# Patient Record
Sex: Male | Born: 2005 | Race: Black or African American | Hispanic: No | Marital: Single | Smoking: Never smoker
Health system: Southern US, Community
[De-identification: ages and names within clinical notes are randomized; demographics above are authoritative.]

## PROBLEM LIST (undated history)

## (undated) DIAGNOSIS — J45909 Unspecified asthma, uncomplicated: Secondary | ICD-10-CM

## (undated) DIAGNOSIS — F419 Anxiety disorder, unspecified: Secondary | ICD-10-CM

## (undated) DIAGNOSIS — K5900 Constipation, unspecified: Secondary | ICD-10-CM

## (undated) DIAGNOSIS — T7840XA Allergy, unspecified, initial encounter: Secondary | ICD-10-CM

## (undated) DIAGNOSIS — L309 Dermatitis, unspecified: Secondary | ICD-10-CM

## (undated) DIAGNOSIS — F4010 Social phobia, unspecified: Secondary | ICD-10-CM

## (undated) DIAGNOSIS — G43909 Migraine, unspecified, not intractable, without status migrainosus: Secondary | ICD-10-CM

## (undated) DIAGNOSIS — F32A Depression, unspecified: Secondary | ICD-10-CM

## (undated) DIAGNOSIS — D563 Thalassemia minor: Secondary | ICD-10-CM

## (undated) DIAGNOSIS — F909 Attention-deficit hyperactivity disorder, unspecified type: Secondary | ICD-10-CM

## (undated) HISTORY — DX: Thalassemia minor: D56.3

## (undated) HISTORY — DX: Dermatitis, unspecified: L30.9

## (undated) HISTORY — DX: Unspecified asthma, uncomplicated: J45.909

## (undated) HISTORY — DX: Migraine, unspecified, not intractable, without status migrainosus: G43.909

## (undated) HISTORY — DX: Depression, unspecified: F32.A

## (undated) HISTORY — DX: Social phobia, unspecified: F40.10

## (undated) HISTORY — DX: Anxiety disorder, unspecified: F41.9

## (undated) HISTORY — DX: Constipation, unspecified: K59.00

## (undated) HISTORY — DX: Attention-deficit hyperactivity disorder, unspecified type: F90.9

## (undated) HISTORY — DX: Allergy, unspecified, initial encounter: T78.40XA

---
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
## 2024-01-01 DIAGNOSIS — F4011 Social phobia, generalized: Secondary | ICD-10-CM | POA: Diagnosis not present

## 2024-01-01 DIAGNOSIS — F29 Unspecified psychosis not due to a substance or known physiological condition: Secondary | ICD-10-CM | POA: Diagnosis not present

## 2024-01-01 DIAGNOSIS — F902 Attention-deficit hyperactivity disorder, combined type: Secondary | ICD-10-CM | POA: Diagnosis not present

## 2024-02-05 ENCOUNTER — Telehealth: Payer: Self-pay

## 2024-02-05 NOTE — Telephone Encounter (Signed)
 Needs 17 yr wcc

## 2024-02-15 DIAGNOSIS — H5213 Myopia, bilateral: Secondary | ICD-10-CM | POA: Diagnosis not present

## 2024-02-17 NOTE — Telephone Encounter (Signed)
2nd attempt-LVM to return call 

## 2024-03-08 ENCOUNTER — Other Ambulatory Visit: Payer: Self-pay | Admitting: Allergy & Immunology

## 2024-03-17 NOTE — Telephone Encounter (Signed)
3rd attempt-LVM to return call 

## 2024-03-24 ENCOUNTER — Ambulatory Visit (INDEPENDENT_AMBULATORY_CARE_PROVIDER_SITE_OTHER): Payer: Medicaid Other | Admitting: Family Medicine

## 2024-03-24 ENCOUNTER — Other Ambulatory Visit: Payer: Self-pay

## 2024-03-24 ENCOUNTER — Encounter: Payer: Self-pay | Admitting: Allergy & Immunology

## 2024-03-24 VITALS — BP 122/72 | HR 76 | Temp 98.2°F | Resp 20 | Ht 66.93 in | Wt 214.0 lb

## 2024-03-24 DIAGNOSIS — T7800XD Anaphylactic reaction due to unspecified food, subsequent encounter: Secondary | ICD-10-CM

## 2024-03-24 DIAGNOSIS — J302 Other seasonal allergic rhinitis: Secondary | ICD-10-CM | POA: Diagnosis not present

## 2024-03-24 DIAGNOSIS — J4540 Moderate persistent asthma, uncomplicated: Secondary | ICD-10-CM

## 2024-03-24 DIAGNOSIS — J3089 Other allergic rhinitis: Secondary | ICD-10-CM | POA: Diagnosis not present

## 2024-03-24 NOTE — Patient Instructions (Addendum)
 Asthma Continue albuterol  2 puffs once every 4 hours if needed for cough or wheeze You may use albuterol  2 puffs 5 to 15 minutes before activity to decrease cough or wheeze Continue Symbicort  160-2 puffs as needed  Allergic rhinitis Continue allergen avoidance measures directed toward grass pollen, weed pollen, tree pollen, mold, dust mite, cat, and cockroach as listed below Continue cetirizine  10 mg once a day if needed for a runny nose or itch Continue Flonase  2 sprays in each nostril once a day if needed for a stuffy nose Continue Patanase  2 sprays in each nostril up to twice a day if needed for runny nose. Consider saline nasal rinses as needed for nasal symptoms. Use this before any medicated nasal spr Jheri ays for best result  Reflux Continue dietary and lifestyle modifications as listed below Continue famotidine  40 mg once a day to control reflux RA Call the clinic if this treatment plan is not working well for you  Follow up in 6 months or sooner if needed.  Reducing Pollen Exposure The American Academy of Allergy, Asthma and Immunology suggests the following steps to reduce your exposure to pollen during allergy seasons. Do not hang sheets or clothing out to dry; pollen may collect on these items. Do not mow lawns or spend time around freshly cut grass; mowing stirs up pollen. Keep windows closed at night.  Keep car windows closed while driving. Minimize morning activities outdoors, a time when pollen counts are usually at their highest. Stay indoors as much as possible when pollen counts or humidity is high and on windy days when pollen tends to remain in the air longer. Use air conditioning when possible.  Many air conditioners have filters that trap the pollen spores. Use a HEPA room air filter to remove pollen form the indoor air you breathe.  Control of Mold Allergen Mold and fungi can grow on a variety of surfaces provided certain temperature and moisture conditions  exist.  Outdoor molds grow on plants, decaying vegetation and soil.  The major outdoor mold, Alternaria and Cladosporium, are found in very high numbers during hot and dry conditions.  Generally, a late Summer - Fall peak is seen for common outdoor fungal spores.  Rain will temporarily lower outdoor mold spore count, but counts rise rapidly when the rainy period ends.  The most important indoor molds are Aspergillus and Penicillium.  Dark, humid and poorly ventilated basements are ideal sites for mold growth.  The next most common sites of mold growth are the bathroom and the kitchen.  Outdoor [employer] Use air conditioning and keep windows closed Avoid exposure to decaying vegetation. Avoid leaf raking. Avoid grain handling. Consider wearing a face mask if working in moldy areas.  Indoor Mold Control Maintain humidity below 50%. Clean washable surfaces with 5% bleach solution. Remove sources e.g. Contaminated carpets.   Control of Dust Mite Allergen Dust mites play a major role in allergic asthma and rhinitis. They occur in environments with high humidity wherever human skin is found. Dust mites absorb humidity from the atmosphere (ie, they do not drink) and feed on organic matter (including shed human and animal skin). Dust mites are a microscopic type of insect that you cannot see with the naked eye. High levels of dust mites have been detected from mattresses, pillows, carpets, upholstered furniture, bed covers, clothes, soft toys and any woven material. The principal allergen of the dust mite is found in its feces. A gram of dust may contain 1,000 mites  and 250,000 fecal particles. Mite antigen is easily measured in the air during house cleaning activities. Dust mites do not bite and do not cause harm to humans, other than by triggering allergies/asthma.  Ways to decrease your exposure to dust mites in your home:  1. Encase mattresses, box springs and pillows with a mite-impermeable  barrier or cover  2. Wash sheets, blankets and drapes weekly in hot water (130 F) with detergent and dry them in a dryer on the hot setting.  3. Have the room cleaned frequently with a vacuum cleaner and a damp dust-mop. For carpeting or rugs, vacuuming with a vacuum cleaner equipped with a high-efficiency particulate air (HEPA) filter. The dust mite allergic individual should not be in a room which is being cleaned and should wait 1 hour after cleaning before going into the room.  4. Do not sleep on upholstered furniture (eg, couches).  5. If possible removing carpeting, upholstered furniture and drapery from the home is ideal. Horizontal blinds should be eliminated in the rooms where the person spends the most time (bedroom, study, television room). Washable vinyl, roller-type shades are optimal.  6. Remove all non-washable stuffed toys from the bedroom. Wash stuffed toys weekly like sheets and blankets above.  7. Reduce indoor humidity to less than 50%. Inexpensive humidity monitors can be purchased at most hardware stores. Do not use a humidifier as can make the problem worse and are not recommended.  Control of Dog or Cat Allergen Avoidance is the best way to manage a dog or cat allergy. If you have a dog or cat and are allergic to dog or cats, consider removing the dog or cat from the home. If you have a dog or cat but don't want to find it a new home, or if your family wants a pet even though someone in the household is allergic, here are some strategies that may help keep symptoms at bay:  Keep the pet out of your bedroom and restrict it to only a few rooms. Be advised that keeping the dog or cat in only one room will not limit the allergens to that room. Don't pet, hug or kiss the dog or cat; if you do, wash your hands with soap and water. High-efficiency particulate air (HEPA) cleaners run continuously in a bedroom or living room can reduce allergen levels over time. Regular use of a  high-efficiency vacuum cleaner or a central vacuum can reduce allergen levels. Giving your dog or cat a bath at least once a week can reduce airborne allergen.   Control of Cockroach Allergen Cockroach allergen has been identified as an important cause of acute attacks of asthma, especially in urban settings.  There are fifty-five species of cockroach that exist in the [place] , however only three, the [place], [place] and [place] species produce allergen that can affect patients with Asthma.  Allergens can be obtained from fecal particles, egg casings and secretions from cockroaches.    Remove food sources. Reduce access to water. Seal access and entry points. Spray runways with 0.5-1% Diazinon or Chlorpyrifos Blow boric acid power under stoves and refrigerator. Place bait stations (hydramethylnon) at feeding sites.   Lifestyle Changes for Controlling GERD When you have GERD, stomach acid feels as if it's backing up toward your mouth. Whether or not you take medication to control your GERD, your symptoms can often be improved with lifestyle changes.   Raise Your Head Reflux is more likely to strike when you're lying down flat, because  stomach fluid can flow backward more easily. Raising the head of your bed 4-6 inches can help. To do this: Slide blocks or books under the legs at the head of your bed. Or, place a wedge under the mattress. Many foam stores can make a suitable wedge for you. The wedge should run from your waist to the top of your head. Don't just prop your head on several pillows. This increases pressure on your stomach. It can make GERD worse.  Watch Your Eating Habits Certain foods may increase the acid in your stomach or relax the lower esophageal sphincter, making GERD more likely. It's best to avoid the following: Coffee, tea, and carbonated drinks (with and without caffeine) Fatty, fried, or spicy food Mint, chocolate, onions, and tomatoes Any other foods  that seem to irritate your stomach or cause you pain  Relieve the Pressure Eat smaller meals, even if you have to eat more often. Don't lie down right after you eat. Wait a few hours for your stomach to empty. Avoid tight belts and tight-fitting clothes. Lose excess weight.  Tobacco and Alcohol Avoid smoking tobacco and drinking alcohol. They can make GERD symptoms worse.

## 2024-03-24 NOTE — Progress Notes (Addendum)
 [address] [place] [address] Dept: [phone]  FOLLOW UP NOTE  Patient ID: Randy [name], male    DOB: 06-Dec-2005  Age: 18 y.o. MRN: [id number] Date of Office Visit: 03/24/2024  Assessment  Chief Complaint: Follow-up  HPI Randy [name] is an 18 year old male who presents to the clinic for follow-up visit.  He was last seen in clinic on 09/22/2023 by Dr. Iva for evaluation of asthma, allergic rhinitis, reflux, and food allergy to peanuts and tree nuts.  He is accompanied by his mother who assists with history.  He continues to study Lobbyist in college.  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  At today's visit, he reports his asthma has been well-controlled with no symptoms including shortness of breath, cough, or wheeze with activity or rest.  He continues Symbicort  as needed and he has used his albuterol  once a month with relief of symptoms.  Allergic rhinitis is reported as moderately well-controlled with symptoms including clear rhinorrhea, sneezing, and postnasal drainage which are all worse after he has been outside.  He continues Flonase  as needed and Patanase  as needed.  He has previously received allergen immunotherapy directed toward grass pollen, weed pollen, tree pollen, mold, dust mite, cat, and cockroach, however, he did have an anaphylactic reaction to allergen immunotherapy.  Reflux is reported as well-controlled with occasional heartburn occurring especially after eating beef.  He continues famotidine  40 mg once a day only if needed for symptoms of reflux.  His current medications are listed in the chart.  Drug Allergies:  Allergies  Allergen Reactions   Shellfish Allergy Itching and Shortness Of Breath   Wheat Itching and Shortness Of Breath   Lactose Diarrhea    Vomiting and Diarrhea   Other Other (See Comments)    red meats, including beef, pork, and mutton   Peanut -Containing Drug Products Itching     Physical Exam: BP 122/72 (BP Location: Left Arm, Patient Position: Sitting, Cuff Size: Normal)   Pulse 76   Temp 98.2 F (36.8 C) (Temporal)   Resp 20   Ht 5' 6.93 (1.7 m)   Wt 214 lb (97.1 kg)   SpO2 96%   BMI 33.59 kg/m    Physical Exam Vitals reviewed.  Constitutional:      Appearance: Normal appearance.  HENT:     Head: Normocephalic and atraumatic.     Right Ear: Tympanic membrane normal.     Left Ear: Tympanic membrane normal.     Nose:     Comments: Bilateral nares slightly erythematous with thin clear nasal drainage noted.  Pharynx normal.  Ears normal.  Eyes normal.    Mouth/Throat:     Pharynx: Oropharynx is clear.   Eyes:     Conjunctiva/sclera: Conjunctivae normal.    Cardiovascular:     Rate and Rhythm: Normal rate and regular rhythm.     Heart sounds: Normal heart sounds. No murmur heard. Pulmonary:     Effort: Pulmonary effort is normal.     Breath sounds: Normal breath sounds.     Comments: Lungs clear to auscultation  Musculoskeletal:        General: Normal range of motion.     Cervical back: Normal range of motion and neck supple.   Skin:    General: Skin is warm and dry.   Neurological:     Mental Status: He is alert and oriented to person, place, and time.   Psychiatric:  Mood and Affect: Mood normal.        Behavior: Behavior normal.        Thought Content: Thought content normal.        Judgment: Judgment normal.     Assessment and Plan: 1. Moderate persistent asthma, uncomplicated   2. Seasonal and perennial allergic rhinitis   3. Anaphylactic shock due to food, subsequent encounter     Patient Instructions  Asthma Continue albuterol  2 puffs once every 4 hours if needed for cough or wheeze You may use albuterol  2 puffs 5 to 15 minutes before activity to decrease cough or wheeze Continue Symbicort  160-2 puffs as needed  Allergic rhinitis Continue allergen avoidance measures directed toward grass pollen, weed  pollen, tree pollen, mold, dust mite, cat, and cockroach as listed below Continue cetirizine  10 mg once a day if needed for a runny nose or itch Continue Flonase  2 sprays in each nostril once a day if needed for a stuffy nose Continue Patanase  2 sprays in each nostril up to twice a day if needed for runny nose. Consider saline nasal rinses as needed for nasal symptoms. Use this before any medicated nasal spr Jheri ays for best result  Reflux Continue dietary and lifestyle modifications as listed below Continue famotidine  40 mg once a day to control reflux  Call the clinic if this treatment plan is not working well for you  Follow up in 6 months or sooner if needed.   Return in about 6 months (around 09/23/2024), or if symptoms worsen or fail to improve.    Thank you for the opportunity to care for this patient.  Please do not hesitate to contact me with questions.  Arlean Mutter, FNP [facility] 

## 2024-03-25 ENCOUNTER — Encounter: Payer: Self-pay | Admitting: Family Medicine

## 2024-03-30 DIAGNOSIS — F4011 Social phobia, generalized: Secondary | ICD-10-CM | POA: Diagnosis not present

## 2024-03-30 DIAGNOSIS — F29 Unspecified psychosis not due to a substance or known physiological condition: Secondary | ICD-10-CM | POA: Diagnosis not present

## 2024-03-30 DIAGNOSIS — F902 Attention-deficit hyperactivity disorder, combined type: Secondary | ICD-10-CM | POA: Diagnosis not present

## 2024-04-19 ENCOUNTER — Other Ambulatory Visit: Payer: Self-pay | Admitting: Allergy & Immunology

## 2024-05-05 DIAGNOSIS — W448XXA Other foreign body entering into or through a natural orifice, initial encounter: Secondary | ICD-10-CM | POA: Diagnosis not present

## 2024-05-05 DIAGNOSIS — T162XXA Foreign body in left ear, initial encounter: Secondary | ICD-10-CM | POA: Diagnosis not present

## 2024-05-20 ENCOUNTER — Other Ambulatory Visit: Payer: Self-pay | Admitting: Allergy & Immunology

## 2024-05-29 ENCOUNTER — Other Ambulatory Visit (INDEPENDENT_AMBULATORY_CARE_PROVIDER_SITE_OTHER): Payer: Self-pay | Admitting: Neurology

## 2024-05-29 DIAGNOSIS — G43109 Migraine with aura, not intractable, without status migrainosus: Secondary | ICD-10-CM

## 2024-05-30 ENCOUNTER — Other Ambulatory Visit: Payer: Self-pay | Admitting: Allergy & Immunology

## 2024-06-07 ENCOUNTER — Other Ambulatory Visit: Payer: Self-pay | Admitting: Allergy & Immunology

## 2024-06-24 DIAGNOSIS — F29 Unspecified psychosis not due to a substance or known physiological condition: Secondary | ICD-10-CM | POA: Diagnosis not present

## 2024-06-24 DIAGNOSIS — F902 Attention-deficit hyperactivity disorder, combined type: Secondary | ICD-10-CM | POA: Diagnosis not present

## 2024-06-24 DIAGNOSIS — F4011 Social phobia, generalized: Secondary | ICD-10-CM | POA: Diagnosis not present

## 2024-08-18 DIAGNOSIS — H6691 Otitis media, unspecified, right ear: Secondary | ICD-10-CM | POA: Diagnosis not present

## 2024-08-25 DIAGNOSIS — J343 Hypertrophy of nasal turbinates: Secondary | ICD-10-CM | POA: Diagnosis not present

## 2024-08-25 DIAGNOSIS — H60333 Swimmer's ear, bilateral: Secondary | ICD-10-CM | POA: Diagnosis not present

## 2024-09-10 ENCOUNTER — Other Ambulatory Visit: Payer: Self-pay | Admitting: Allergy & Immunology

## 2024-09-16 DIAGNOSIS — F4321 Adjustment disorder with depressed mood: Secondary | ICD-10-CM | POA: Diagnosis not present

## 2024-09-16 DIAGNOSIS — F4011 Social phobia, generalized: Secondary | ICD-10-CM | POA: Diagnosis not present

## 2024-09-16 DIAGNOSIS — F902 Attention-deficit hyperactivity disorder, combined type: Secondary | ICD-10-CM | POA: Diagnosis not present

## 2024-10-08 ENCOUNTER — Encounter: Payer: Self-pay | Admitting: Allergy & Immunology

## 2024-10-08 ENCOUNTER — Ambulatory Visit: Admitting: Allergy & Immunology

## 2024-10-08 ENCOUNTER — Other Ambulatory Visit: Payer: Self-pay

## 2024-10-08 ENCOUNTER — Other Ambulatory Visit: Payer: Self-pay | Admitting: Allergy & Immunology

## 2024-10-08 VITALS — BP 124/76 | HR 72 | Temp 98.2°F | Ht 67.99 in | Wt 216.2 lb

## 2024-10-08 DIAGNOSIS — J302 Other seasonal allergic rhinitis: Secondary | ICD-10-CM

## 2024-10-08 DIAGNOSIS — J3089 Other allergic rhinitis: Secondary | ICD-10-CM | POA: Diagnosis not present

## 2024-10-08 DIAGNOSIS — L2089 Other atopic dermatitis: Secondary | ICD-10-CM | POA: Diagnosis not present

## 2024-10-08 DIAGNOSIS — T7800XD Anaphylactic reaction due to unspecified food, subsequent encounter: Secondary | ICD-10-CM

## 2024-10-08 DIAGNOSIS — J4541 Moderate persistent asthma with (acute) exacerbation: Secondary | ICD-10-CM | POA: Diagnosis not present

## 2024-10-08 DIAGNOSIS — K219 Gastro-esophageal reflux disease without esophagitis: Secondary | ICD-10-CM

## 2024-10-08 MED ORDER — FLUTICASONE PROPIONATE 50 MCG/ACT NA SUSP: 2.0000 | Freq: Every day | NASAL | 1 refills | Status: AC

## 2024-10-08 MED ORDER — CETIRIZINE HCL 10 MG PO TABS: 10.0000 mg | ORAL_TABLET | Freq: Every day | ORAL | 1 refills | Status: AC

## 2024-10-08 MED ORDER — PREDNISONE 10 MG PO TABS: ORAL_TABLET | ORAL | 0 refills | Status: AC

## 2024-10-08 MED ORDER — ALBUTEROL SULFATE HFA 108 (90 BASE) MCG/ACT IN AERS: 2.0000 | INHALATION_SPRAY | RESPIRATORY_TRACT | 2 refills | Status: AC | PRN

## 2024-10-08 MED ORDER — SYMBICORT 160-4.5 MCG/ACT IN AERO: INHALATION_SPRAY | RESPIRATORY_TRACT | 1 refills | Status: AC

## 2024-10-08 NOTE — Patient Instructions (Addendum)
 1. Mild persistent asthma with acute exacerbation - Lung testing looked a lot lower today, but it did improve with the albuterol . - Start the prednisone  taper and continue for 9 days. - Continue with the Symbicort  and use 2 puffs every 4-6 hours for ONE TO TWO WEEKS. - Daily controller medication(s): NOTHING - Rescue medications: albuterol  4 puffs every 4-6 hours as needed - Changes during respiratory infections or worsening symptoms: Add on Symbicort  160/4.50mcg to 2 puffs every 4-6 hours for ONE TO TWO WEEKS. - Asthma control goals:  * Full participation in all desired activities (may need albuterol  before activity) * Albuterol  use two time or less a week on average (not counting use with activity) * Cough interfering with sleep two time or less a month * Oral steroids no more than once a year * No hospitalizations  3. Seasonal and perennial allergic rhinitis - Continue Flonase  (fluticasone ) 1-2 sprays each nostril once a day AS NEEDED for stuffy nose - Continue with levocetirizine 5mg  daily.   3. Adverse food reaction (peanuts, tree nuts) - Continue to avoid peanuts and tree nuts. - Consider a food challenge at some point to get this off of his allergy list.  - Avoid peanuts and tree nuts. In case of an allergic reaction, give Benadryl 4 teaspoonfuls every 4 hours, and if life-threatening symptoms occur, inject with EpiPen  0.3 mg.  4. GERD  - Continue with Pepcid  (famotidine ) 40mg  daily. - Refills sent in.   5. Return in about 3 months (around 01/06/2025). You can have the follow up appointment with Dr. Iva or a Nurse Practicioner (our Nurse Practitioners are excellent and always have Physician oversight!).    Please inform us  of any Emergency Department visits, hospitalizations, or changes in symptoms. Call us  before going to the ED for breathing or allergy symptoms since we might be able to fit you in for a sick visit. Feel free to contact us  anytime with any questions,  problems, or concerns.  It was a pleasure to see you and your family again today!  Websites that have reliable patient information: 1. American Academy of Asthma, Allergy, and Immunology: www.aaaai.org 2. Food Allergy Research and Education (FARE): foodallergy.org 3. Mothers of Asthmatics: http://www.asthmacommunitynetwork.org 4. American College of Allergy, Asthma, and Immunology: www.acaai.org      Like us  on [employer] and Instagram for our latest updates!      A healthy democracy works best when [employer] participate! Make sure you are registered to vote! If you have moved or changed any of your contact information, you will need to get this updated before voting! Scan the QR codes below to learn more!

## 2024-10-08 NOTE — Progress Notes (Unsigned)
 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 Mild persistent asthma with acute exacerbation - Lung testing looked a lot lower today, but it did improve with the albuterol . - Start the prednisone  taper and continue for 9 days. - Continue with the Symbicort  and use 2 puffs every 4-6 hours for ONE TO TWO WEEKS. - Daily controller medication(s): NOTHING - Rescue medications: albuterol  4 puffs every 4-6 hours as needed - Changes during respiratory infections or worsening symptoms: Add on Symbicort  160/4.66mcg to 2 puffs every 4-6 hours for ONE TO TWO WEEKS. - Asthma control goals:  * Full participation in all desired activities (may need albuterol  before activity) * Albuterol  use two time or less a week on average (not counting use with activity) * Cough interfering with sleep two time or less a month * Oral steroids no more than once a year * No hospitalizations  3. Seasonal and perennial allergic rhinitis - Continue Flonase  (fluticasone ) 1-2 sprays each nostril once a day AS NEEDED for stuffy nose - Continue with levocetirizine 5mg  daily.   3. Adverse food reaction (peanuts, tree nuts) - Continue to avoid peanuts and tree nuts. - Consider a food challenge at some point to get this off of his allergy list.  - Avoid peanuts and tree nuts. In case of an allergic reaction, give Benadryl 4 teaspoonfuls every 4 hours, and if life-threatening symptoms occur, inject with EpiPen  0.3 mg.  4. GERD  - Continue with Pepcid  (famotidine ) 40mg  daily. - Refills sent in.   5. Return in about 3  months (around 01/06/2025). You can have the follow up appointment with Dr. Iva or a Nurse Practicioner (our Nurse Practitioners are excellent and always have Physician oversight!).    Please inform us  of any Emergency Department visits, hospitalizations, or changes in symptoms. Call us  before going to the ED for breathing or allergy symptoms since we might be able to fit you in for a sick visit. Feel free to contact us  anytime with any questions, problems, or concerns.  It was a pleasure to see you and your family again today!  Websites that have reliable patient information: 1. American Academy of Asthma, Allergy, and Immunology: www.aaaai.org 2. Food Allergy Research and Education (FARE): foodallergy.org 3. Mothers of Asthmatics: http://www.asthmacommunitynetwork.org 4. American College of Allergy, Asthma, and Immunology: www.acaai.org      Like us  on [employer] and Instagram for our latest updates!      A healthy democracy works best when [employer] participate! Make sure you are registered to vote! If you have moved or changed any of your contact information, you will need to get this updated before voting! Scan the QR codes below to learn more!                  Subjective:   ROBERTT BUDA is a 19 y.o. male presenting today for follow up of  Chief Complaint  Patient presents with   Asthma   Anaphylactic shock due to food   Follow-up    Running nose,hot and cold   Cough    LUSTER HECHLER has  a history of the following: Patient Active Problem List   Diagnosis Date Noted   Seasonal allergic conjunctivitis 10/17/2022   Seasonal and perennial allergic rhinitis 04/07/2018   Anaphylactic shock due to adverse food reaction 10/07/2017   Adverse food reaction 03/25/2017   Other seasonal allergic rhinitis 03/25/2017   Mild persistent asthma without complication 03/25/2017   Schizophrenia (HCC) 11/21/2015   Migraine with aura and without status  migrainosus, not intractable 11/29/2014   Tension headache 11/29/2014   Anxiety 11/29/2014   Circadian rhythm sleep disorder, 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  Discussed the use of AI scribe software for clinical note transcription with the patient and/or guardian, who gave verbal consent to proceed.  Rickard is a 19 y.o. male presenting for {Blank single:19197::a food challenge,a drug challenge,skin testing,a sick visit,an evaluation of ***,a follow up visit}. He was last seen in June 2025 by Arlean Mutter.  At that time, we continue with albuterol  as needed.  He also was doing the Symbicort  2 puffs as needed as well.  For his allergic rhinitis, he remained on Zyrtec  as well as Flonase  and Patanase .  Reflux was Limiting.  Since last visit,  Asthma/Respiratory Symptom History: ***  Allergic Rhinitis Symptom History: ***  Food Allergy Symptom History: ***  Skin Symptom History: ***  GERD Symptom History: ***  Infection Symptom History: ***  Otherwise, there have been no changes to his past medical history, surgical history, family history, or social history.    Review of systems otherwise negative other than that mentioned in the HPI.    Objective:   Blood pressure 124/76, pulse 72, temperature 98.2 F (36.8 C), temperature source Temporal, height 5' 7.99 (1.727 m), weight 216 lb 3.2 oz (98.1 kg), SpO2 97%. Body mass index is 32.88 kg/m.    Physical Exam   Diagnostic studies:    Spirometry: results abnormal (FEV1: 2.36/66%, FVC: 3.11/75%, FEV1/FVC: 76%).    Spirometry consistent with possible restrictive disease. Albuterol /Atrovent nebulizer treatment given in clinic with significant improvement in  FEV1 and FVC per ATS criteria.  Allergy Studies: {Blank single:19197::none,deferred due to recent antihistamine use,deferred due to insurance stipulations that require a separate visit for testing,labs sent instead, }    {Blank single:19197::Allergy testing results were read and interpreted by myself, documented by clinical staff., }      Marty Shaggy, MD  [facility]         "

## 2024-10-12 ENCOUNTER — Encounter: Payer: Self-pay | Admitting: Allergy & Immunology

## 2025-01-10 ENCOUNTER — Ambulatory Visit: Admitting: Family Medicine
